# Patient Record
Sex: Male | Born: 1995 | Race: White | Hispanic: No | Marital: Single | Smoking: Never smoker
Health system: Southern US, Community
[De-identification: ages and names within clinical notes are randomized; demographics above are authoritative.]

## PROBLEM LIST (undated history)

## (undated) DIAGNOSIS — S42309A Unspecified fracture of shaft of humerus, unspecified arm, initial encounter for closed fracture: Secondary | ICD-10-CM

## (undated) DIAGNOSIS — S0285XA Fracture of orbit, unspecified, initial encounter for closed fracture: Secondary | ICD-10-CM

## (undated) HISTORY — PX: ORBITAL FRACTURE SURGERY: SHX725

---
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
## 2014-05-25 NOTE — Op Note (Signed)
PATIENT NAME:  Stanley Powers, Stanley G MR#:  [id number] DATE OF BIRTH:  06-22-1995  DATE OF PROCEDURE:  05/26/2011  PREOPERATIVE DIAGNOSIS: Left orbital floor fracture.  POSTOPERATIVE DIAGNOSES:  1. Left orbital floor fracture. 2. Left zygomaticomaxillary fracture. 3. Left inferior orbital rim fracture.  PROCEDURE: Transconjunctival approach to left orbital floor, inferior orbital rim, zygomaticomaxillary buttress fracture.   SURGEON: Zackery BarefootJ. Madison Lamari Youngers, M.D.   DESCRIPTION OF PROCEDURE: The patient was placed in the supine position, on the operating room table. After general endotracheal anesthesia had been induced, the patient was turned 90 degrees clockwise from anesthesia. The left eye was topically anesthetized with topical proparacaine. A corneal shield was placed. In the inferior fornix, 0.5% lidocaine and 0.25% bupivacaine mixed 1:150,000 with epinephrine was injected. This was left in place for 10 minutes. The face was prepped and draped in the usual fashion. A #15 blade was used to make an incision just anterior to the inferior fornix, of the lower eyelid. The dissection was carried down post-septally and the orbital fat was preserved without violation of the orbital fat. The medial orbital rim was skeletonized taking care not to perforate back through the orbital septum anteriorly. The depressed fragments were meticulously skeletonized and there was some difficulty with reduction of the large segment of maxillary bone. The rim fracture extended in the typical location back through the inferior orbital rim and through the inferior wall of the orbit. There was a palpable fracture just lateral to the maxillary eminence, at the anteriorly most junction of the zygomatic arch. This was reduced with manual closed reduction, in addition to the open reduction, after skeletonization of the most medial portion of the maxilla, at the inferior orbital rim. Once this had been skeletonized and reduced sufficiently  and all of the periorbita and orbital fat had been reduced out of the floor fracture, a 1.2 mm Leibinger plate was cut to fit the defect. The plate was carefully placed and secured at two points with a medial self-drilling screw and a lateral self-tapping screw. This led to adequate reduction of the fractures, fixation of the fractures, and reelevation of the orbital tissues back out of the entrapment evidenced during elevation of the inferior periorbita. The wound was closed with a 5-0 Vicryl and the periosteum covering the plate, and the conjunctival incision was closed with interrupted 6-0 fast absorbing gut sutures. The patient was returned to anesthesia, allowed to emerge from anesthesia in the operating room, and taken to room in stable condition. There were no complications. Estimated blood loss was 5 mL. ____________________________ J. Gertie BaronMadison Lacreshia Bondarenko, MD jmc:slb D: 05/27/2011 04:54:[id number]:28:22 ET T: 05/27/2011 10:27:04 ET JOB#: [id number]  cc: Zackery BarefootJ. Madison Ryin Ambrosius, MD, <Dictator> Wendee CoppJMADISON Jaeceon Michelin MD ELECTRONICALLY SIGNED 06/01/2011 18:29

## 2015-10-21 DIAGNOSIS — S060XAA Concussion with loss of consciousness status unknown, initial encounter: Secondary | ICD-10-CM | POA: Insufficient documentation

## 2016-08-01 HISTORY — PX: WISDOM TOOTH EXTRACTION: SHX21

## 2016-08-11 ENCOUNTER — Ambulatory Visit
Admission: EM | Admit: 2016-08-11 | Discharge: 2016-08-11 | Disposition: A | Discharge status: Home / Self Care | Payer: 59 | Attending: Family Medicine | Admitting: Family Medicine

## 2016-08-11 ENCOUNTER — Encounter: Payer: Self-pay | Admitting: Gynecology

## 2016-08-11 DIAGNOSIS — R310 Gross hematuria: Secondary | ICD-10-CM | POA: Diagnosis not present

## 2016-08-11 HISTORY — DX: Unspecified fracture of shaft of humerus, unspecified arm, initial encounter for closed fracture: S42.309A

## 2016-08-11 HISTORY — DX: Fracture of orbit, unspecified, initial encounter for closed fracture: S02.85XA

## 2016-08-11 LAB — BASIC METABOLIC PANEL
Anion gap: 11 (ref 5–15)
BUN: 24 mg/dL — ABNORMAL HIGH (ref 6–20)
CO2: 24 mmol/L (ref 22–32)
Calcium: 9.5 mg/dL (ref 8.9–10.3)
Chloride: 102 mmol/L (ref 101–111)
Creatinine, Ser: 1 mg/dL (ref 0.61–1.24)
GFR calc Af Amer: >60 mL/min (ref >60)
GFR calc non Af Amer: >60 mL/min (ref >60)
Glucose, Bld: 94 mg/dL (ref 65–99)
Potassium: 3.6 mmol/L (ref 3.5–5.1)
Sodium: 137 mmol/L (ref 135–145)

## 2016-08-11 LAB — CBC WITH DIFFERENTIAL/PLATELET
Basophils Absolute: 0.1 10*3/uL (ref 0–0.1)
Basophils Relative: 1 %
Eosinophils Absolute: 0.3 10*3/uL (ref 0–0.7)
Eosinophils Relative: 3 %
HCT: 42.3 % (ref 40.0–52.0)
Hemoglobin: 14.7 g/dL (ref 13.0–18.0)
Lymphocytes Relative: 42 %
Lymphs Abs: 4.2 10*3/uL — ABNORMAL HIGH (ref 1.0–3.6)
MCH: 29.8 pg (ref 26.0–34.0)
MCHC: 34.8 g/dL (ref 32.0–36.0)
MCV: 85.5 fL (ref 80.0–100.0)
Monocytes Absolute: 0.9 10*3/uL (ref 0.2–1.0)
Monocytes Relative: 8 %
Neutro Abs: 4.6 10*3/uL (ref 1.4–6.5)
Neutrophils Relative %: 46 %
Platelets: 281 10*3/uL (ref 150–440)
RBC: 4.95 MIL/uL (ref 4.40–5.90)
RDW: 13.1 % (ref 11.5–14.5)
WBC: 10.1 10*3/uL (ref 3.8–10.6)

## 2016-08-11 LAB — URINALYSIS, COMPLETE (UACMP) WITH MICROSCOPIC
BILIRUBIN URINE: NEGATIVE
Bacteria, UA: NONE SEEN
GLUCOSE, UA: NEGATIVE mg/dL
KETONES UR: NEGATIVE mg/dL
LEUKOCYTES UA: NEGATIVE
NITRITE: NEGATIVE
PH: 5.5 (ref 5.0–8.0)
PROTEIN: 30 mg/dL — AB
SQUAMOUS EPITHELIAL / LPF: NONE SEEN
Specific Gravity, Urine: 1.02 (ref 1.005–1.030)
WBC UA: NONE SEEN WBC/hpf (ref 0–5)

## 2016-08-11 MED ORDER — NITROFURANTOIN MONOHYD MACRO 100 MG PO CAPS
100.0000 mg | ORAL_CAPSULE | Freq: Two times a day (BID) | ORAL | 0 refills | Status: DC
Start: 2016-08-11 — End: 2019-10-09

## 2016-08-11 NOTE — ED Provider Notes (Signed)
CSN: [id number]     Arrival date & time 08/11/16  1725 History   First MD Initiated Contact with Patient 08/11/16 1756     Chief Complaint  Patient presents with  . Hematuria   (Consider location/radiation/quality/duration/timing/severity/associated sxs/prior Treatment) HPI  This is a 21 year old male who presents with symptomatic gross hematuria. The patient works [place] a [place]. He states that this morning when he urinated he noticed some blood. When he got home this afternoon he noticed it again. It is gross.Marland Kitchen Has no clots. Said no fever or chills. Denies any testicular or penile pain. Never had this before. He denies any penile trauma.        Past Medical History:  Diagnosis Date  . Broken arm   . Orbital fracture [facility])    Past Surgical History:  Procedure Laterality Date  . ORBITAL FRACTURE SURGERY    . WISDOM TOOTH EXTRACTION Bilateral 08/01/2016   No family history on file. Social History  Substance Use Topics  . Smoking status: Never Smoker  . Smokeless tobacco: Never Used  . Alcohol use Not on file    Review of Systems  Constitutional: Negative for activity change, chills, fatigue and fever.  Genitourinary: Positive for hematuria. Negative for decreased urine volume, difficulty urinating, discharge, dysuria, flank pain, frequency, genital sores, penile pain, penile swelling, scrotal swelling, testicular pain and urgency.  All other systems reviewed and are negative.   Allergies  Patient has no known allergies.  Home Medications   Prior to Admission medications   Medication Sig Start Date End Date Taking? Authorizing Provider  HYDROcodone-acetaminophen (NORCO/VICODIN) 5-325 MG tablet Take 1 tablet by mouth every 6 (six) hours as needed for moderate pain.   Yes [provider]  ibuprofen (ADVIL,MOTRIN) 600 MG tablet Take 600 mg by mouth every 6 (six) hours as needed.   Yes [provider]  nitrofurantoin, macrocrystal-monohydrate,  (MACROBID) 100 MG capsule Take 1 capsule (100 mg total) by mouth 2 (two) times daily. 08/11/16   Lutricia Feil, PA-C   Meds Ordered and Administered this Visit  Medications - No data to display  BP 119/73 (BP Location: Left Arm)   Pulse 69   Temp 98.2 F (36.8 C) (Oral)   Resp 16   Ht 6\' 2"  (1.88 m)   Wt 170 lb (77.1 kg)   SpO2 100%   BMI 21.83 kg/m  No data found.   Physical Exam  Constitutional: He is oriented to person, place, and time. He appears well-developed and well-nourished. No distress.  HENT:  Head: Normocephalic.  Eyes: Pupils are equal, round, and reactive to light.  Neck: Normal range of motion.  Pulmonary/Chest: Effort normal and breath sounds normal.  Abdominal: Soft. Bowel sounds are normal. He exhibits no distension and no mass. There is no tenderness. There is no rebound and no guarding.  Genitourinary:  Genitourinary Comments: Deferred for urologist  Musculoskeletal: Normal range of motion.  Neurological: He is alert and oriented to person, place, and time.  Skin: Skin is warm and dry. He is not diaphoretic.  Psychiatric: He has a normal mood and affect. His behavior is normal. Judgment and thought content normal.  Nursing note and vitals reviewed.   Urgent Care Course     Procedures (including critical care time)  Labs Review Labs Reviewed  URINALYSIS, COMPLETE (UACMP) WITH MICROSCOPIC - Abnormal; Notable for the following:       Result Value   Color, Urine RED (*)    APPearance CLOUDY (*)  Hgb urine dipstick LARGE (*)    Protein, ur 30 (*)    All other components within normal limits  CBC WITH DIFFERENTIAL/PLATELET - Abnormal; Notable for the following:    Lymphs Abs 4.2 (*)    All other components within normal limits  BASIC METABOLIC PANEL - Abnormal; Notable for the following:    BUN 24 (*)    All other components within normal limits  URINE CULTURE    Imaging Review No results found.   Visual Acuity Review  Right Eye  Distance:   Left Eye Distance:   Bilateral Distance:    Right Eye Near:   Left Eye Near:    Bilateral Near:         MDM   1. Gross hematuria    Discharge Medication List as of 08/11/2016  7:21 PM    START taking these medications   Details  nitrofurantoin, macrocrystal-monohydrate, (MACROBID) 100 MG capsule Take 1 capsule (100 mg total) by mouth 2 (two) times daily., Starting Thu 08/11/2016, Normal      Plan: 1. Test/x-ray results and diagnosis reviewed with patient 2. rx as per orders; risks, benefits, potential side effects reviewed with patient 3. Recommend supportive treatment with Drinking increased fluids. Increasing use of ibuprofen. I recommended one ibuprofen combined with 1 Tylenol 500 for pain only as necessary. She [facility] He'll call make the appointment tomorrow. If he has any change in his condition he will go immediately to the emergency room tonight. Urine culture results will be available in 48 hours. 4. F/u prn if symptoms worsen or don't improve     Lutricia FeilRoemer, William P, PA-C 08/11/16 1947

## 2016-08-11 NOTE — ED Triage Notes (Signed)
Patient stated notice blood in his urine today at work after using the restroom.

## 2016-08-11 NOTE — Discharge Instructions (Signed)
Drink plenty of fluids. Decrease ibuprofen. Recommend combining 1 ibuprofen with Tylenol 500 mg for pain as necessary. Arrange an appointment with urologist as soon as possible.

## 2016-08-13 LAB — URINE CULTURE: Culture: NO GROWTH

## 2018-11-13 ENCOUNTER — Other Ambulatory Visit: Payer: Self-pay

## 2018-11-13 DIAGNOSIS — Z20822 Contact with and (suspected) exposure to covid-19: Secondary | ICD-10-CM

## 2018-11-15 LAB — NOVEL CORONAVIRUS, NAA: SARS-CoV-2, NAA: NOT DETECTED

## 2019-05-16 ENCOUNTER — Ambulatory Visit: Payer: 59 | Admitting: Dermatology

## 2019-06-26 ENCOUNTER — Ambulatory Visit: Payer: 59 | Admitting: Dermatology

## 2019-06-28 ENCOUNTER — Ambulatory Visit: Payer: 59 | Admitting: Dermatology

## 2019-10-03 ENCOUNTER — Other Ambulatory Visit: Payer: Self-pay

## 2019-10-03 ENCOUNTER — Ambulatory Visit: Payer: Self-pay

## 2019-10-03 DIAGNOSIS — Z021 Encounter for pre-employment examination: Secondary | ICD-10-CM

## 2019-10-03 LAB — POCT URINALYSIS DIPSTICK
Bilirubin, UA: NEGATIVE
Blood, UA: NEGATIVE
Glucose, UA: NEGATIVE
Ketones, UA: NEGATIVE
Leukocytes, UA: NEGATIVE
Nitrite, UA: NEGATIVE
Protein, UA: NEGATIVE
Spec Grav, UA: 1.02 (ref 1.010–1.025)
Urobilinogen, UA: 0.2 E.U./dL
pH, UA: 6.5 (ref 5.0–8.0)

## 2019-10-03 NOTE — Progress Notes (Signed)
Pt presents today for partial Pre employment Physical and UDS.   Lab [employer] ID:  [id number]

## 2019-10-05 LAB — CMP12+LP+TP+TSH+6AC+CBC/D/PLT
ALT: 13 IU/L (ref 0–44)
AST: 12 IU/L (ref 0–40)
Albumin/Globulin Ratio: 1.6 (ref 1.2–2.2)
Albumin: 5.1 g/dL (ref 4.1–5.2)
Alkaline Phosphatase: 74 IU/L (ref 48–121)
BUN/Creatinine Ratio: 21 — ABNORMAL HIGH (ref 9–20)
BUN: 19 mg/dL (ref 6–20)
Basophils Absolute: 0 10*3/uL (ref 0.0–0.2)
Basos: 1 %
Bilirubin Total: 0.6 mg/dL (ref 0.0–1.2)
Calcium: 10.1 mg/dL (ref 8.7–10.2)
Chloride: 100 mmol/L (ref 96–106)
Chol/HDL Ratio: 3.4 ratio (ref 0.0–5.0)
Cholesterol, Total: 162 mg/dL (ref 100–199)
Creatinine, Ser: 0.91 mg/dL (ref 0.76–1.27)
EOS (ABSOLUTE): 0 10*3/uL (ref 0.0–0.4)
Eos: 1 %
Estimated CHD Risk: 0.5 times avg. (ref 0.0–1.0)
Free Thyroxine Index: 1.9 (ref 1.2–4.9)
GFR calc Af Amer: 136 mL/min/{1.73_m2} (ref >59)
GFR calc non Af Amer: 118 mL/min/{1.73_m2} (ref >59)
GGT: 11 IU/L (ref 0–65)
Globulin, Total: 3.1 g/dL (ref 1.5–4.5)
Glucose: 82 mg/dL (ref 65–99)
HDL: 48 mg/dL (ref >39)
Hematocrit: 53.9 % — ABNORMAL HIGH (ref 37.5–51.0)
Hemoglobin: 18.7 g/dL — ABNORMAL HIGH (ref 13.0–17.7)
Immature Grans (Abs): 0 10*3/uL (ref 0.0–0.1)
Immature Granulocytes: 0 %
Iron: 189 ug/dL — ABNORMAL HIGH (ref 38–169)
LDH: 120 IU/L — ABNORMAL LOW (ref 121–224)
LDL Chol Calc (NIH): 99 mg/dL (ref 0–99)
Lymphocytes Absolute: 2.5 10*3/uL (ref 0.7–3.1)
Lymphs: 39 %
MCH: 30.2 pg (ref 26.6–33.0)
MCHC: 34.7 g/dL (ref 31.5–35.7)
MCV: 87 fL (ref 79–97)
Monocytes Absolute: 0.5 10*3/uL (ref 0.1–0.9)
Monocytes: 8 %
Neutrophils Absolute: 3.3 10*3/uL (ref 1.4–7.0)
Neutrophils: 51 %
Phosphorus: 3.5 mg/dL (ref 2.8–4.1)
Platelets: 302 10*3/uL (ref 150–450)
Potassium: 4.5 mmol/L (ref 3.5–5.2)
RBC: 6.2 x10E6/uL — ABNORMAL HIGH (ref 4.14–5.80)
RDW: 13.1 % (ref 11.6–15.4)
Sodium: 138 mmol/L (ref 134–144)
T3 Uptake Ratio: 29 % (ref 24–39)
T4, Total: 6.5 ug/dL (ref 4.5–12.0)
TSH: 2.96 u[IU]/mL (ref 0.450–4.500)
Total Protein: 8.2 g/dL (ref 6.0–8.5)
Triglycerides: 76 mg/dL (ref 0–149)
Uric Acid: 4.4 mg/dL (ref 3.8–8.4)
VLDL Cholesterol Cal: 15 mg/dL (ref 5–40)
WBC: 6.3 10*3/uL (ref 3.4–10.8)

## 2019-10-05 LAB — QUANTIFERON-TB GOLD PLUS
QuantiFERON Mitogen Value: >10 IU/mL
QuantiFERON Nil Value: 0.04 IU/mL
QuantiFERON TB1 Ag Value: 0.04 IU/mL
QuantiFERON TB2 Ag Value: 0.07 IU/mL
QuantiFERON-TB Gold Plus: NEGATIVE

## 2019-10-05 LAB — HEPATITIS B SURFACE ANTIBODY,QUALITATIVE: Hep B Surface Ab, Qual: NONREACTIVE

## 2019-10-09 ENCOUNTER — Encounter: Payer: Self-pay | Admitting: Physician Assistant

## 2019-10-09 ENCOUNTER — Ambulatory Visit: Payer: Self-pay | Admitting: Physician Assistant

## 2019-10-09 ENCOUNTER — Other Ambulatory Visit: Payer: Self-pay

## 2019-10-09 VITALS — BP 107/70 | HR 68 | Temp 99.0°F | Resp 12 | Ht 74.0 in | Wt 176.0 lb

## 2019-10-09 DIAGNOSIS — Z021 Encounter for pre-employment examination: Secondary | ICD-10-CM

## 2019-10-09 NOTE — Progress Notes (Signed)
° °  Subjective: Firefighter exam    Patient ID: Stanley Powers, male    DOB: [birth year], 24 y.o.   MRN: [id number]  HPI Patient presents with cough exam voices no complaints or concerns.   Review of Systems Negative    Objective:   Physical Exam HEENT is unremarkable.  Neck is supple for adenopathy or bruits.  Lungs are clear to auscultation.  Heart regular rate and rhythm.  Abdomen negative HSM, normoactive bowel sounds, soft and nontender to palpation.  No obvious deformity to the upper or lower extremities.  Patient has full and equal range of motion of the upper and lower extremities.  No obvious cervical or lumbar spine deformity.  Patient has full and equal range of motion of the cervical lumbar spine.  Cranial nerves II through XII are grossly intact.       Assessment & Plan: Well exam  Discussed labs and EKG results with patient.  Follow-up as necessary.

## 2019-10-15 ENCOUNTER — Other Ambulatory Visit: Payer: Self-pay

## 2019-10-15 ENCOUNTER — Ambulatory Visit: Payer: Self-pay

## 2019-10-15 DIAGNOSIS — Z23 Encounter for immunization: Secondary | ICD-10-CM

## 2019-10-15 NOTE — Progress Notes (Signed)
Pt presented today for Tdap & Hep vaccinations. CL,RMA

## 2019-11-29 ENCOUNTER — Other Ambulatory Visit: Payer: Self-pay

## 2019-11-29 DIAGNOSIS — Z0184 Encounter for antibody response examination: Secondary | ICD-10-CM

## 2019-11-30 LAB — HEPATITIS B SURFACE ANTIBODY,QUALITATIVE: Hep B Surface Ab, Qual: REACTIVE

## 2021-03-02 ENCOUNTER — Ambulatory Visit: Payer: Self-pay

## 2021-03-02 ENCOUNTER — Other Ambulatory Visit: Payer: Self-pay

## 2021-03-02 DIAGNOSIS — Z0000 Encounter for general adult medical examination without abnormal findings: Secondary | ICD-10-CM

## 2021-03-02 DIAGNOSIS — Z0289 Encounter for other administrative examinations: Secondary | ICD-10-CM

## 2021-03-02 LAB — POCT URINALYSIS DIPSTICK
Appearance: NORMAL
Bilirubin, UA: NEGATIVE
Blood, UA: NEGATIVE
Glucose, UA: NEGATIVE
Ketones, UA: NEGATIVE
Leukocytes, UA: NEGATIVE
Nitrite, UA: NEGATIVE
Protein, UA: NEGATIVE
Spec Grav, UA: <=1.005 — AB (ref 1.010–1.025)
Urobilinogen, UA: 0.2 E.U./dL
pH, UA: 7 (ref 5.0–8.0)

## 2021-03-02 NOTE — Progress Notes (Signed)
03/08/21 annual physical sheduled

## 2021-03-03 LAB — CMP12+LP+TP+TSH+6AC+CBC/D/PLT
ALT: 17 IU/L (ref 0–44)
AST: 18 IU/L (ref 0–40)
Albumin/Globulin Ratio: 2 (ref 1.2–2.2)
Albumin: 4.7 g/dL (ref 4.1–5.2)
Alkaline Phosphatase: 57 IU/L (ref 44–121)
BUN/Creatinine Ratio: 20 (ref 9–20)
BUN: 16 mg/dL (ref 6–20)
Basophils Absolute: 0 10*3/uL (ref 0.0–0.2)
Basos: 0 %
Bilirubin Total: 0.7 mg/dL (ref 0.0–1.2)
Calcium: 9.8 mg/dL (ref 8.7–10.2)
Chloride: 101 mmol/L (ref 96–106)
Chol/HDL Ratio: 2.8 ratio (ref 0.0–5.0)
Cholesterol, Total: 162 mg/dL (ref 100–199)
Creatinine, Ser: 0.82 mg/dL (ref 0.76–1.27)
EOS (ABSOLUTE): 0.1 10*3/uL (ref 0.0–0.4)
Eos: 2 %
Estimated CHD Risk: <0.5 times avg. (ref 0.0–1.0)
Free Thyroxine Index: 1.7 (ref 1.2–4.9)
GGT: 11 IU/L (ref 0–65)
Globulin, Total: 2.4 g/dL (ref 1.5–4.5)
Glucose: 93 mg/dL (ref 70–99)
HDL: 58 mg/dL (ref >39)
Hematocrit: 45.7 % (ref 37.5–51.0)
Hemoglobin: 15.7 g/dL (ref 13.0–17.7)
Immature Grans (Abs): 0 10*3/uL (ref 0.0–0.1)
Immature Granulocytes: 0 %
Iron: 245 ug/dL — ABNORMAL HIGH (ref 38–169)
LDH: 118 IU/L — ABNORMAL LOW (ref 121–224)
LDL Chol Calc (NIH): 92 mg/dL (ref 0–99)
Lymphocytes Absolute: 2.6 10*3/uL (ref 0.7–3.1)
Lymphs: 48 %
MCH: 29.7 pg (ref 26.6–33.0)
MCHC: 34.4 g/dL (ref 31.5–35.7)
MCV: 86 fL (ref 79–97)
Monocytes Absolute: 0.4 10*3/uL (ref 0.1–0.9)
Monocytes: 7 %
Neutrophils Absolute: 2.4 10*3/uL (ref 1.4–7.0)
Neutrophils: 43 %
Phosphorus: 3.6 mg/dL (ref 2.8–4.1)
Platelets: 279 10*3/uL (ref 150–450)
Potassium: 4.1 mmol/L (ref 3.5–5.2)
RBC: 5.29 x10E6/uL (ref 4.14–5.80)
RDW: 12.7 % (ref 11.6–15.4)
Sodium: 138 mmol/L (ref 134–144)
T3 Uptake Ratio: 27 % (ref 24–39)
T4, Total: 6.3 ug/dL (ref 4.5–12.0)
TSH: 2.45 u[IU]/mL (ref 0.450–4.500)
Total Protein: 7.1 g/dL (ref 6.0–8.5)
Triglycerides: 63 mg/dL (ref 0–149)
Uric Acid: 5.1 mg/dL (ref 3.8–8.4)
VLDL Cholesterol Cal: 12 mg/dL (ref 5–40)
WBC: 5.6 10*3/uL (ref 3.4–10.8)
eGFR: 124 mL/min/{1.73_m2} (ref >59)

## 2021-03-08 ENCOUNTER — Encounter: Payer: Self-pay | Admitting: Physician Assistant

## 2021-03-08 ENCOUNTER — Other Ambulatory Visit: Payer: Self-pay

## 2021-03-08 ENCOUNTER — Ambulatory Visit: Payer: Self-pay | Admitting: Physician Assistant

## 2021-03-08 DIAGNOSIS — Z0000 Encounter for general adult medical examination without abnormal findings: Secondary | ICD-10-CM

## 2021-03-08 NOTE — Progress Notes (Signed)
Pt concerned of iron levels being to high in labs and wants to discuss it./CL,RMA

## 2021-03-08 NOTE — Progress Notes (Signed)
[place] [facility]  ____________________________________________   None    (approximate)  I have reviewed the triage vital signs and the nursing notes.   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  Patient is most concerned secondary to increasing iron levels.  Denies history of anemia.         Past Medical History:  Diagnosis Date   Broken arm    Orbital fracture [facility])     Patient Active Problem List   Diagnosis Date Noted   Concussion 10/21/2015    Past Surgical History:  Procedure Laterality Date   ORBITAL FRACTURE SURGERY     WISDOM TOOTH EXTRACTION Bilateral 08/01/2016    Prior to Admission medications   Not on File    Allergies Patient has no known allergies.  History reviewed. No pertinent family history.  Social History Social History   Tobacco Use   Smoking status: Never   Smokeless tobacco: Never  Substance Use Topics   Drug use: No    Review of Systems Constitutional: No fever/chills Eyes: No visual changes. ENT: No sore throat. Cardiovascular: Denies chest pain. Respiratory: Denies shortness of breath. Gastrointestinal: No abdominal pain.  No nausea, no vomiting.  No diarrhea.  No constipation. Genitourinary: Negative for dysuria. Musculoskeletal: Negative for back pain. Skin: Negative for rash. Neurological: Negative for headaches, focal weakness or numbness. Hematological/Lymphatic: Increased iron levels   ____________________________________________   PHYSICAL EXAM:  VITAL SIGNS: Temperature is 98, pulse 97, respiration 14, BP is 125/75, patient with 185 pounds and BMI is 23.75. Constitutional: Alert and oriented. Well appearing and in no acute distress. Eyes: Conjunctivae are normal. PERRL. EOMI. Head: Atraumatic. Nose: No congestion/rhinnorhea. Mouth/Throat: Mucous membranes are moist.  Oropharynx  non-erythematous. Neck: No stridor.  No cervical spine tenderness to palpation. Hematological/Lymphatic/Immunilogical: No cervical lymphadenopathy. Cardiovascular: Normal rate, regular rhythm. Grossly normal heart sounds.  Good peripheral circulation. Respiratory: Normal respiratory effort.  No retractions. Lungs CTAB. Gastrointestinal: Soft and nontender. No distention. No abdominal bruits. No CVA tenderness. Genitourinary: Deferred Musculoskeletal: No lower extremity tenderness nor edema.  No joint effusions. Neurologic:  Normal speech and language. No gross focal neurologic deficits are appreciated. No gait instability. Skin:  Skin is warm, dry and intact. No rash noted. Psychiatric: Mood and affect are normal. Speech and behavior are normal.  ____________________________________________   LABS        Component Ref Range & Units 6 d ago (03/02/21) 1 yr ago (10/03/19) 4 yr ago (08/11/16) 4 yr ago (08/11/16)  Glucose 70 - 99 mg/dL 93  82 R  94 R    Uric Acid 3.8 - 8.4 mg/dL 5.1  4.4 CM     Comment:            Therapeutic target for gout patients: <6.0  BUN 6 - 20 mg/dL '16  19  24 ' High     Creatinine, Ser 0.76 - 1.27 mg/dL 0.82  0.91  1.00 R    eGFR >59 mL/min/1.73 124      BUN/Creatinine Ratio 9 - '20 20  21 ' High      Sodium 134 - 144 mmol/L 138  138  137 R    Potassium 3.5 - 5.2 mmol/L 4.1  4.5  3.6 R    Chloride 96 - 106 mmol/L 101  100  102 R    Calcium 8.7 - 10.2 mg/dL 9.8  10.1  9.5  R    Phosphorus 2.8 - 4.1 mg/dL 3.6  3.5     Total Protein 6.0 - 8.5 g/dL 7.1  8.2     Albumin 4.1 - 5.2 g/dL 4.7  5.1     Globulin, Total 1.5 - 4.5 g/dL 2.4  3.1     Albumin/Globulin Ratio 1.2 - 2.2 2.0  1.6     Bilirubin Total 0.0 - 1.2 mg/dL 0.7  0.6     Alkaline Phosphatase 44 - 121 IU/L 57  74 R, CM     LDH 121 - 224 IU/L 118 Low   120 Low      AST 0 - 40 IU/L 18  12     ALT 0 - 44 IU/L 17  13     GGT 0 - 65 IU/L 11  11     Iron 38 - 169 ug/dL 245 High   189 High      Cholesterol, Total  100 - 199 mg/dL 162  162     Triglycerides 0 - 149 mg/dL 63  76     HDL >39 mg/dL 58  48     VLDL Cholesterol Cal 5 - 40 mg/dL 12  15     LDL Chol Calc (NIH) 0 - 99 mg/dL 92  99     Chol/HDL Ratio 0.0 - 5.0 ratio 2.8  3.4 CM     Comment:                                   T. Chol/HDL Ratio                                              Men  Women                                1/2 Avg.Risk  3.4    3.3                                    Avg.Risk  5.0    4.4                                 2X Avg.Risk  9.6    7.1                                 3X Avg.Risk 23.4   11.0   Estimated CHD Risk 0.0 - 1.0 times avg.  < 0.5  0.5 CM     Comment: The CHD Risk is based on the T. Chol/HDL ratio. Other  factors affect CHD Risk such as hypertension, smoking,  diabetes, severe obesity, and family history of  premature CHD.   TSH 0.450 - 4.500 uIU/mL 2.450  2.960     T4, Total 4.5 - 12.0 ug/dL 6.3  6.5     T3 Uptake Ratio 24 - 39 % 27  29     Free Thyroxine Index 1.2 - 4.9 1.7  1.9     WBC 3.4 - 10.8 x10E3/uL 5.6  6.3  10.1 R   RBC 4.14 - 5.80 x10E6/uL 5.29  6.20 High    4.95 R   Hemoglobin 13.0 - 17.7 g/dL 15.7  18.7 High    14.7 R   Hematocrit 37.5 - 51.0 % 45.7  53.9 High    42.3 R   MCV 79 - 97 fL 86  87   85.5 R   MCH 26.6 - 33.0 pg 29.7  30.2   29.8 R   MCHC 31.5 - 35.7 g/dL 34.4  34.7   34.8 R   RDW 11.6 - 15.4 % 12.7  13.1   13.1 R   Platelets 150 - 450 x10E3/uL 279  302   281 R   Neutrophils Not Estab. % 43  51   46 R   Lymphs Not Estab. % 48  39     Monocytes Not Estab. % 7  8     Eos Not Estab. % 2  1     Basos Not Estab. % 0  1     Neutrophils Absolute 1.4 - 7.0 x10E3/uL 2.4  3.3   4.6 R   Lymphocytes Absolute 0.7 - 3.1 x10E3/uL 2.6  2.5   4.2 High  R   Monocytes Absolute 0.1 - 0.9 x10E3/uL 0.4  0.5     EOS (ABSOLUTE) 0.0 - 0.4 x10E3/uL 0.1  0.0     Basophils Absolute 0.0 - 0.2 x10E3/uL 0.0  0.0   0.1 R   Immature Granulocytes Not Estab. % 0  0     Immature Grans (Abs) 0.0 - 0.1  x10E3/uL 0.0  0.0     Resulting Agency  [facility]       Narrative Performed by: Maryan Puls Performed at:  [facility]  [address] [place], [place]  [id number]  Lab Director: Rush Farmer MD, Phone:  [phone]    Specimen Collected: 03/02/21 09:07 Last Resulted: 03/03/21 08:13      Lab Flowsheet    Order Details    View Encounter    Lab and Collection Details    Routing    Result History    View Encounter Conversation      CM=Additional comments  R=Reference range differs from displayed range      Result Care Coordination   Patient Communication   Add Comments   Seen Back to Top       Other Results from 03/02/2021   Contains abnormal data POCT urinalysis dipstick Order: [id number] Status: Final result    Visible to patient: Yes (seen)    Next appt: None    Dx: Annual physical exam    0 Result Notes       Component Ref Range & Units 6 d ago 1 yr ago 4 yr ago  Color, UA  light yellow  yellow    Clarity, UA  clear  clear    Glucose, UA Negative Negative  Negative    Bilirubin, UA  negative  negative    Ketones, UA  negative  negative    Spec Grav, UA 1.010 - 1.025 <=1.005 Abnormal   1.020    Blood, UA  negative  negative    pH, UA 5.0 - 8.0 7.0  6.5    Protein, UA Negative Negative  Negative    Urobilinogen, UA 0.2 or 1.0 E.U./dL 0.2  0.2    Nitrite, UA  negative  negative    Leukocytes, UA Negative Negative  Negative  NEGATIVE R  Appearance  normal  medium  CLOUDY Abnormal  R             ___________________________________________  EKG Sinus  Rhythm  -With rate variation  cv = 11. -RSR(V1) -nondiagnostic.   Voltage criteria for LVH  (R(V5) exceeds 2.60 mV)  -Voltage criteria w/o ST/T abnormality may be normal.  61 bpm. BORDERLINE.   ____________________________________________   ____________________________________________   INITIAL IMPRESSION / ASSESSMENT AND PLAN   As part of my medical decision  making, I reviewed the following data within the electronic MEDICAL RECORD NUMBER   Discussed hemochromatosis with patient.  We will get a ferritin level and follow-up in 2 to 3 days.           ____________________________________________   FINAL CLINICAL IMPRESSION(S) DIAGNOSES  Well exam.   ED Discharge Orders          Ordered    Ferritin       Comments: Elevated iron levels without anemia    03/08/21 1042             Note:  This document was prepared using Dragon voice recognition software and may include unintentional dictation errors.

## 2021-03-09 ENCOUNTER — Other Ambulatory Visit: Payer: Self-pay | Admitting: Physician Assistant

## 2021-03-09 LAB — FERRITIN: Ferritin: 186 ng/mL (ref 30–400)

## 2021-03-10 LAB — IRON AND TIBC
Iron Saturation: 17 % (ref 15–55)
Iron: 49 ug/dL (ref 38–169)
Total Iron Binding Capacity: 290 ug/dL (ref 250–450)
UIBC: 241 ug/dL (ref 111–343)

## 2021-03-10 LAB — SPECIMEN STATUS REPORT

## 2021-03-11 ENCOUNTER — Other Ambulatory Visit: Payer: Self-pay

## 2021-03-11 NOTE — Progress Notes (Signed)
Pt presents today for lab work per [facility]

## 2021-03-16 LAB — HEMOCHROMATOSIS DNA-PCR(C282Y,H63D)

## 2022-02-10 ENCOUNTER — Telehealth: Payer: Self-pay

## 2022-02-10 NOTE — Telephone Encounter (Addendum)
Pt will call back Tuesday to schedule physical.

## 2022-02-22 ENCOUNTER — Ambulatory Visit: Payer: Self-pay

## 2022-02-22 DIAGNOSIS — Z0000 Encounter for general adult medical examination without abnormal findings: Secondary | ICD-10-CM

## 2022-02-22 LAB — POCT URINALYSIS DIPSTICK
Bilirubin, UA: NEGATIVE
Blood, UA: NEGATIVE
Glucose, UA: NEGATIVE
Ketones, UA: NEGATIVE
Leukocytes, UA: NEGATIVE
Nitrite, UA: NEGATIVE
Protein, UA: NEGATIVE
Spec Grav, UA: 1.02 (ref 1.010–1.025)
Urobilinogen, UA: 0.2 E.U./dL
pH, UA: 7 (ref 5.0–8.0)

## 2022-02-22 NOTE — Progress Notes (Signed)
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 Pt scheduled to return and complete.

## 2022-02-23 LAB — CMP12+LP+TP+TSH+6AC+CBC/D/PLT
ALT: 16 IU/L (ref 0–44)
AST: 13 IU/L (ref 0–40)
Albumin/Globulin Ratio: 1.9 (ref 1.2–2.2)
Albumin: 4.7 g/dL (ref 4.3–5.2)
Alkaline Phosphatase: 54 IU/L (ref 44–121)
BUN/Creatinine Ratio: 20 (ref 9–20)
BUN: 16 mg/dL (ref 6–20)
Basophils Absolute: 0 10*3/uL (ref 0.0–0.2)
Basos: 1 %
Bilirubin Total: 0.5 mg/dL (ref 0.0–1.2)
Calcium: 9.8 mg/dL (ref 8.7–10.2)
Chloride: 104 mmol/L (ref 96–106)
Chol/HDL Ratio: 3.4 ratio (ref 0.0–5.0)
Cholesterol, Total: 179 mg/dL (ref 100–199)
Creatinine, Ser: 0.8 mg/dL (ref 0.76–1.27)
EOS (ABSOLUTE): 0.1 10*3/uL (ref 0.0–0.4)
Eos: 2 %
Estimated CHD Risk: 0.5 times avg. (ref 0.0–1.0)
Free Thyroxine Index: 2 (ref 1.2–4.9)
GGT: 9 IU/L (ref 0–65)
Globulin, Total: 2.5 g/dL (ref 1.5–4.5)
Glucose: 90 mg/dL (ref 70–99)
HDL: 52 mg/dL (ref >39)
Hematocrit: 46.2 % (ref 37.5–51.0)
Hemoglobin: 15.9 g/dL (ref 13.0–17.7)
Immature Grans (Abs): 0.1 10*3/uL (ref 0.0–0.1)
Immature Granulocytes: 1 %
Iron: 167 ug/dL (ref 38–169)
LDH: 126 IU/L (ref 121–224)
LDL Chol Calc (NIH): 117 mg/dL — ABNORMAL HIGH (ref 0–99)
Lymphocytes Absolute: 2.7 10*3/uL (ref 0.7–3.1)
Lymphs: 46 %
MCH: 30.2 pg (ref 26.6–33.0)
MCHC: 34.4 g/dL (ref 31.5–35.7)
MCV: 88 fL (ref 79–97)
Monocytes Absolute: 0.4 10*3/uL (ref 0.1–0.9)
Monocytes: 7 %
Neutrophils Absolute: 2.5 10*3/uL (ref 1.4–7.0)
Neutrophils: 43 %
Phosphorus: 3.2 mg/dL (ref 2.8–4.1)
Platelets: 273 10*3/uL (ref 150–450)
Potassium: 4.2 mmol/L (ref 3.5–5.2)
RBC: 5.27 x10E6/uL (ref 4.14–5.80)
RDW: 12.4 % (ref 11.6–15.4)
Sodium: 142 mmol/L (ref 134–144)
T3 Uptake Ratio: 30 % (ref 24–39)
T4, Total: 6.7 ug/dL (ref 4.5–12.0)
TSH: 2.27 u[IU]/mL (ref 0.450–4.500)
Total Protein: 7.2 g/dL (ref 6.0–8.5)
Triglycerides: 53 mg/dL (ref 0–149)
Uric Acid: 4.5 mg/dL (ref 3.8–8.4)
VLDL Cholesterol Cal: 10 mg/dL (ref 5–40)
WBC: 5.8 10*3/uL (ref 3.4–10.8)
eGFR: 124 mL/min/{1.73_m2} (ref >59)

## 2022-02-28 ENCOUNTER — Ambulatory Visit: Payer: Self-pay | Admitting: Physician Assistant

## 2022-02-28 ENCOUNTER — Encounter: Payer: Self-pay | Admitting: Physician Assistant

## 2022-02-28 VITALS — BP 116/75 | HR 70 | Temp 98.0°F | Resp 12 | Ht 74.0 in | Wt 190.0 lb

## 2022-02-28 DIAGNOSIS — Z0000 Encounter for general adult medical examination without abnormal findings: Secondary | ICD-10-CM

## 2022-02-28 NOTE — Progress Notes (Signed)
[facility]   ____________________________________________   None    (approximate)  I have reviewed the triage vital signs and the nursing notes.   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  Patient voiced no concerns or complaints.         Past Medical History:  Diagnosis Date   Broken arm    Orbital fracture [facility])     Patient Active Problem List   Diagnosis Date Noted   Concussion 10/21/2015    Past Surgical History:  Procedure Laterality Date   ORBITAL FRACTURE SURGERY     WISDOM TOOTH EXTRACTION Bilateral 08/01/2016    Prior to Admission medications   Not on File    Allergies Patient has no known allergies.  History reviewed. No pertinent family history.  Social History Social History   Tobacco Use   Smoking status: Never   Smokeless tobacco: Never  Substance Use Topics   Drug use: No    Review of Systems Constitutional: No fever/chills Eyes: No visual changes. ENT: No sore throat. Cardiovascular: Denies chest pain. Respiratory: Denies shortness of breath. Gastrointestinal: No abdominal pain.  No nausea, no vomiting.  No diarrhea.  No constipation. Genitourinary: Negative for dysuria. Musculoskeletal: Negative for back pain. Skin: Negative for rash. Neurological: Negative for headaches, focal weakness or numbness.   ____________________________________________   PHYSICAL EXAM:  VITAL SIGNS: BP is 116/75, pulse 70, respiration 12, temperature 98, and patient 100 send O2 sat on room air.  Patient weighs 190 pounds and BMI is 24.39. Constitutional: Alert and oriented. Well appearing and in no acute distress. Eyes: Conjunctivae are normal. PERRL. EOMI. Head: Atraumatic. Nose: No congestion/rhinnorhea. Mouth/Throat: Mucous membranes are moist.  Oropharynx non-erythematous. Neck:  No stridor.  No cervical spine tenderness to palpation. Hematological/Lymphatic/Immunilogical: No cervical lymphadenopathy. Cardiovascular: Normal rate, regular rhythm. Grossly normal heart sounds.  Good peripheral circulation. Respiratory: Normal respiratory effort.  No retractions. Lungs CTAB. Gastrointestinal: Soft and nontender. No distention. No abdominal bruits. No CVA tenderness. Genitourinary: Deferred Musculoskeletal: No lower extremity tenderness nor edema.  No joint effusions. Neurologic:  Normal speech and language. No gross focal neurologic deficits are appreciated. No gait instability. Skin:  Skin is warm, dry and intact. No rash noted. Psychiatric: Mood and affect are normal. Speech and behavior are normal.  ____________________________________________   LABS _        Component Ref Range & Units 6 d ago 12 mo ago 2 yr ago 5 yr ago  Color, UA  Dark Yellow light yellow yellow   Clarity, UA  Clear clear clear   Glucose, UA Negative Negative Negative Negative   Bilirubin, UA  Negative negative negative   Ketones, UA  Negative negative negative   Spec Grav, UA 1.010 - 1.025 1.020 <=1.005 Abnormal  1.020   Blood, UA  Negative negative negative   pH, UA 5.0 - 8.0 7.0 7.0 6.5   Protein, UA Negative Negative Negative Negative   Urobilinogen, UA 0.2 or 1.0 E.U./dL 0.2 0.2 0.2   Nitrite, UA  Negative negative negative   Leukocytes, UA Negative Negative Negative Negative NEGATIVE R  Appearance   normal medium CLOUDY Abnormal  R  Odor       Resulting Agency     [facility]         Specimen Collected: 02/22/22 09:48 Last Resulted: 02/22/22 09:48  Lab Development worker, community History    View All Conversations on this Encounter      R=Reference range differs from displayed range      Result Care Coordination   Patient Communication   Add Comments   Seen Back to Top       Other Results from 02/22/2022   Contains abnormal data CMP12+LP+TP+TSH+6AC+CBC/D/Plt Order: [id number] Status: Final result      Visible to patient: Yes (seen)      Next appt: None      Dx: Annual physical exam    0 Result Notes             Component Ref Range & Units 6 d ago (02/22/22) 11 mo ago (03/08/21) 12 mo ago (03/02/21) 2 yr ago (10/03/19) 5 yr ago (08/11/16) 5 yr ago (08/11/16)  Glucose 70 - 99 mg/dL 90  93 82 R 94 R   Uric Acid 3.8 - 8.4 mg/dL 4.5  5.1 CM 4.4 CM    Comment:            Therapeutic target for gout patients: <6.0  BUN 6 - 20 mg/dL 16  16 19 24  High    Creatinine, Ser 0.76 - 1.27 mg/dL  6.96 7.89 3.81 R   eGFR >59 mL/min/1.73 124  124     BUN/Creatinine Ratio 9 - 20 20  20 21  High     Sodium 134 - 144 mmol/L 142  138 138 137 R   Potassium 3.5 - 5.2 mmol/L 4.2  4.1 4.5 3.6 R   Chloride 96 - 106 mmol/L 104  101 100 102 R   Calcium 8.7 - 10.2 mg/dL 9.8  9.8 0.17 9.5 R   Phosphorus 2.8 - 4.1 mg/dL 3.2  3.6 3.5    Total Protein 6.0 - 8.5 g/dL 7.2  7.1 8.2    Albumin 4.3 - 5.2 g/dL 4.7  4.7 R 5.1 R    Globulin, Total 1.5 - 4.5 g/dL 2.5  2.4 3.1    Albumin/Globulin Ratio 1.2 - 2.2 1.9  2.0 1.6    Bilirubin Total 0.0 - 1.2 mg/dL 0.5  0.7 0.6    Alkaline Phosphatase 44 - 121 IU/L 54  57 74 R, CM    LDH 121 - 224 IU/L 126  118 Low  120 Low     AST 0 - 40 IU/L 13  18 12     ALT 0 - 44 IU/L 16  17 13     GGT 0 - 65 IU/L 9  11 11     Iron 38 - 169 ug/dL 49 51.0 High  High     Cholesterol, Total 100 - 199 mg/dL  258    Triglycerides 0 - 149 mg/dL 53  63 76    HDL 527 mg/dL 52  58 48    VLDL Cholesterol Cal 5 - 40 mg/dL 10  12 15     LDL Chol Calc (NIH) 0 - 99 mg/dL 782 High   92 99    Chol/HDL Ratio 0.0 - 5.0 ratio 3.4  2.8 CM 3.4 CM    Comment:  T. Chol/HDL Ratio                                             Men  Women                               1/2 Avg.Risk  3.4    3.3                                    Avg.Risk  5.0    4.4                                2X Avg.Risk  9.6    7.1                                3X Avg.Risk 23.4   11.0  Estimated CHD Risk 0.0 - 1.0 times avg. 0.5   < 0.5 CM 0.5 CM    Comment: The CHD Risk is based on the T. Chol/HDL ratio. Other factors affect CHD Risk such as hypertension, smoking, diabetes, severe obesity, and family history of premature CHD.  TSH 0.450 - 4.500 uIU/mL 2.270  2.450 2.960    T4, Total 4.5 - 12.0 ug/dL 6.7  6.3 6.5    T3 Uptake Ratio 24 - 39 % 30  27 29     Free Thyroxine Index 1.2 - 4.9 2.0  1.7 1.9    WBC 3.4 - 10.8 x10E3/uL 5.8  5.6 6.3  10.1 R  RBC 4.14 - 5.80 x10E6/uL 5.27  5.29 6.20 High   4.95 R  Hemoglobin 13.0 - 17.7 g/dL 15.9  15.7 18.7 High   14.7 R  Hematocrit 37.5 - 51.0 % 46.2  45.7 53.9 High   42.3 R  MCV 79 - 97 fL 88  86 87  85.5 R  MCH 26.6 - 33.0 pg 30.2  29.7 30.2  29.8 R  MCHC 31.5 - 35.7 g/dL 34.4  34.4 34.7  34.8 R  RDW 11.6 - 15.4 % 12.4  12.7 13.1  13.1 R  Platelets 150 - 450 x10E3/uL 273  279 302  281 R  Neutrophils Not Estab. % 43  43 51  46 R  Lymphs Not Estab. % 46  48 39    Monocytes Not Estab. % 7  7 8     Eos Not Estab. % 2  2 1     Basos Not Estab. % 1  0 1    Neutrophils Absolute 1.4 - 7.0 x10E3/uL 2.5  2.4 3.3  4.6 R  Lymphocytes Absolute 0.7 - 3.1 x10E3/uL 2.7  2.6 2.5  4.2 High  R  Monocytes Absolute 0.1 - 0.9 x10E3/uL 0.4  0.4 0.5    EOS (ABSOLUTE) 0.0 - 0.4 x10E3/uL 0.1  0.1 0.0    Basophils Absolute 0.0 - 0.2 x10E3/uL 0.0  0.0 0.0  0.1 R  Immature Granulocytes Not Estab. % 1  0 0                 ___________________________________________  EKG  Left axis deviation consistent with LAFB.  Asymptomatic at 64 bpm. ____________________________________________  ____________________________________________   INITIAL IMPRESSION / ASSESSMENT AND PLAN  As part of my medical decision making, I reviewed the following data within the [facility]         Discussed no acute  findings on physical exam.     ____________________________________________   FINAL CLINICAL IMPRESSION Well exam   ED Discharge Orders     None        Note:  This document was prepared using Dragon voice recognition software and may include unintentional dictation errors.

## 2022-06-29 ENCOUNTER — Other Ambulatory Visit: Payer: Self-pay

## 2022-06-29 ENCOUNTER — Ambulatory Visit
Admission: EM | Admit: 2022-06-29 | Discharge: 2022-06-29 | Disposition: A | Discharge status: Home / Self Care | Payer: 59 | Attending: Family Medicine | Admitting: Family Medicine

## 2022-06-29 DIAGNOSIS — S0502XA Injury of conjunctiva and corneal abrasion without foreign body, left eye, initial encounter: Secondary | ICD-10-CM

## 2022-06-29 MED ORDER — ERYTHROMYCIN 5 MG/GM OP OINT: TOPICAL_OINTMENT | OPHTHALMIC | 0 refills | Status: DC

## 2022-06-29 NOTE — ED Triage Notes (Addendum)
Pt thinks he may have gotten something in his left eye today. Now having burning and irritation of left eye. Flushed eyes with saline and pt unable to relieve irritation. Pt denies difficulty seeing.

## 2022-06-29 NOTE — ED Provider Notes (Signed)
[facility]    CSN: [id number] Arrival date & time: 06/29/22  1920      History   Chief Complaint Chief Complaint  Patient presents with   Eye irritation    HPI HPI  Stanley Powers is a 27 y.o. male.    Stanley Powers presents for left eye pain that started tonight.  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  He also got a hair cut today. He flushed his eye in at home with his mom's contact solution.  Stanley Powers feels like something is still in his eye but does not see anything.   Stanley Powers does not wear glasses or contacts.  Stanley Powers has not had any trouble seeing.  However, eye pain remains.  Stanley Powers has otherwise been well and has no additional concerns today.   Past Medical History:  Diagnosis Date   Broken arm    Orbital fracture [facility])     Patient Active Problem List   Diagnosis Date Noted   Concussion 10/21/2015    Past Surgical History:  Procedure Laterality Date   ORBITAL FRACTURE SURGERY     WISDOM TOOTH EXTRACTION Bilateral 08/01/2016       Home Medications    Prior to Admission medications   Medication Sig Start Date End Date Taking? Authorizing Provider  erythromycin ophthalmic ointment Place a 1/2 inch ribbon of ointment into the lower eyelid 3 times a day for the next week. 06/29/22  Yes Katha Cabal, DO    Family History History reviewed. No pertinent family history.  Social History Social History   Tobacco Use   Smoking status: Never   Smokeless tobacco: Never  Substance Use Topics   Alcohol use: Yes   Drug use: No     Allergies   Patient has no known allergies.   Review of Systems Review of Systems : negative unless otherwise stated in HPI.      Physical Exam Triage Vital Signs ED Triage Vitals [06/29/22 1944]  Enc Vitals Group     BP      Pulse      Resp      Temp      Temp src      SpO2      Weight      Height      Head Circumference      Peak Flow      Pain Score 7     Pain Loc       Pain Edu?      Excl. in GC?    No data found.  Updated Vital Signs BP 124/73   Pulse 86   Temp 99 F (37.2 C)   Resp 20   SpO2 100%   Visual Acuity Right Eye Distance:   Left Eye Distance:   Bilateral Distance: 20/20  Right Eye Near:   Left Eye Near:    Bilateral Near:     Physical Exam  GEN: pleasant well appearing male, in no acute distress  NECK: normal ROM  CV: regular rate  RESP: no increased work of breathing EYES:     General: Lids are normal. Lids are everted, no foreign bodies appreciated. Vision grossly intact. Gaze aligned appropriately.        Right eye: No discharge.        Left eye: No foreign body, discharge or hordeolum.     Extraocular Movements: Extraocular movements intact.     Conjunctiva/sclera:     Left eye: Left conjunctiva  is injected. No chemosis or hemorrhage.    Comments: fluorescein stain performed, Corneal abrasions in the 8 through 10 o'clock position and 3 through 5 o'clock positions, saline rinsed and inspected for foreign bodies  SKIN: warm and dry   UC Treatments / Results  Labs (all labs ordered are listed, but only abnormal results are displayed) Labs Reviewed - No data to display  EKG   Radiology No results found.  Procedures Procedures (including critical care time)  Medications Ordered in UC Medications - No data to display  Initial Impression / Assessment and Plan / UC Course  I have reviewed the triage vital signs and the nursing notes.  Pertinent labs & imaging results that were available during my care of the patient were reviewed by me and considered in my medical decision making (see chart for details).     Patient is a 27 y.o. male who presents after left eye pain.  On exam, he has a evidence of corneal abrasion on the left.  Treat with erythromycin ointment 3 times daily for 7 days.  Advised to follow-up with an ophthalmologist or optometrist, if  discomfort/pain is not improving after 5-7day course.  Recommended pt pick up eye patch from the pharmacy, if desired.  Understanding voiced.  Discussed MDM, treatment plan and plan for follow-up with patient who agrees with plan.  Final Clinical Impressions(s) / UC Diagnoses   Final diagnoses:  Abrasion of left cornea, initial encounter     Discharge Instructions      Stop by the pharmacy to pick up your antibiotic eye medication.  Follow up with your primary eyecare provider or [facility] if symptoms suddenly worsen or you have little improvement in your eye symptoms.        ED Prescriptions     Medication Sig Dispense Auth. Provider   erythromycin ophthalmic ointment Place a 1/2 inch ribbon of ointment into the lower eyelid 3 times a day for the next week. 3.5 g Katha Cabal, DO      PDMP not reviewed this encounter.   Katha Cabal, DO 07/04/22 0321

## 2022-06-29 NOTE — Discharge Instructions (Addendum)
Stop by the pharmacy to pick up your antibiotic eye medication.  Follow up with your primary eyecare provider or [facility] if symptoms suddenly worsen or you have little improvement in your eye symptoms.

## 2023-01-06 ENCOUNTER — Ambulatory Visit: Payer: Self-pay

## 2023-01-06 DIAGNOSIS — Z0289 Encounter for other administrative examinations: Secondary | ICD-10-CM

## 2023-01-06 LAB — POCT URINALYSIS DIPSTICK
Bilirubin, UA: NEGATIVE
Blood, UA: NEGATIVE
Glucose, UA: NEGATIVE
Ketones, UA: NEGATIVE
Leukocytes, UA: NEGATIVE
Nitrite, UA: NEGATIVE
Protein, UA: NEGATIVE
Spec Grav, UA: <=1.005 — AB (ref 1.010–1.025)
Urobilinogen, UA: 0.2 U/dL
pH, UA: 6 (ref 5.0–8.0)

## 2023-01-06 NOTE — Progress Notes (Signed)
Pt completed labs for FF physical./CL,RMA 

## 2023-01-07 LAB — CMP12+LP+TP+TSH+6AC+CBC/D/PLT
ALT: 19 [IU]/L (ref 0–44)
AST: 17 [IU]/L (ref 0–40)
Albumin: 4.8 g/dL (ref 4.3–5.2)
Alkaline Phosphatase: 64 [IU]/L (ref 44–121)
BUN/Creatinine Ratio: 17 (ref 9–20)
BUN: 15 mg/dL (ref 6–20)
Basophils Absolute: 0 10*3/uL (ref 0.0–0.2)
Basos: 1 %
Bilirubin Total: 0.5 mg/dL (ref 0.0–1.2)
Calcium: 10.1 mg/dL (ref 8.7–10.2)
Chloride: 105 mmol/L (ref 96–106)
Chol/HDL Ratio: 3.2 {ratio} (ref 0.0–5.0)
Cholesterol, Total: 188 mg/dL (ref 100–199)
Creatinine, Ser: 0.86 mg/dL (ref 0.76–1.27)
EOS (ABSOLUTE): 0.1 10*3/uL (ref 0.0–0.4)
Eos: 1 %
Estimated CHD Risk: <0.5 times avg. (ref 0.0–1.0)
Free Thyroxine Index: 2 (ref 1.2–4.9)
GGT: 13 [IU]/L (ref 0–65)
Globulin, Total: 2.7 g/dL (ref 1.5–4.5)
Glucose: 92 mg/dL (ref 70–99)
HDL: 58 mg/dL (ref >39)
Hematocrit: 47.2 % (ref 37.5–51.0)
Hemoglobin: 16 g/dL (ref 13.0–17.7)
Immature Grans (Abs): 0 10*3/uL (ref 0.0–0.1)
Immature Granulocytes: 0 %
Iron: 129 ug/dL (ref 38–169)
LDH: 131 [IU]/L (ref 121–224)
LDL Chol Calc (NIH): 121 mg/dL — ABNORMAL HIGH (ref 0–99)
Lymphocytes Absolute: 2.6 10*3/uL (ref 0.7–3.1)
Lymphs: 40 %
MCH: 29.6 pg (ref 26.6–33.0)
MCHC: 33.9 g/dL (ref 31.5–35.7)
MCV: 87 fL (ref 79–97)
Monocytes Absolute: 0.4 10*3/uL (ref 0.1–0.9)
Monocytes: 6 %
Neutrophils Absolute: 3.4 10*3/uL (ref 1.4–7.0)
Neutrophils: 52 %
Phosphorus: 3.6 mg/dL (ref 2.8–4.1)
Platelets: 306 10*3/uL (ref 150–450)
Potassium: 4.1 mmol/L (ref 3.5–5.2)
RBC: 5.4 x10E6/uL (ref 4.14–5.80)
RDW: 12.3 % (ref 11.6–15.4)
Sodium: 141 mmol/L (ref 134–144)
T3 Uptake Ratio: 26 % (ref 24–39)
T4, Total: 7.6 ug/dL (ref 4.5–12.0)
TSH: 3.18 u[IU]/mL (ref 0.450–4.500)
Total Protein: 7.5 g/dL (ref 6.0–8.5)
Triglycerides: 44 mg/dL (ref 0–149)
Uric Acid: 4.9 mg/dL (ref 3.8–8.4)
VLDL Cholesterol Cal: 9 mg/dL (ref 5–40)
WBC: 6.4 10*3/uL (ref 3.4–10.8)
eGFR: 122 mL/min/{1.73_m2} (ref >59)

## 2023-01-10 ENCOUNTER — Encounter: Payer: Self-pay | Admitting: Physician Assistant

## 2023-01-10 ENCOUNTER — Ambulatory Visit: Payer: Self-pay | Admitting: Physician Assistant

## 2023-01-10 VITALS — BP 107/74 | HR 76 | Resp 16 | Ht 74.0 in | Wt 185.0 lb

## 2023-01-10 DIAGNOSIS — Z0289 Encounter for other administrative examinations: Secondary | ICD-10-CM

## 2023-01-10 NOTE — Progress Notes (Signed)
[facility] ____________________________________________   None    (approximate)  I have reviewed the triage vital signs and the nursing notes.   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  Patient was no concerns or complaints.        { Past Medical History:  Diagnosis Date   Broken arm    Orbital fracture [facility])     Patient Active Problem List   Diagnosis Date Noted   Concussion 10/21/2015    Past Surgical History:  Procedure Laterality Date   ORBITAL FRACTURE SURGERY     WISDOM TOOTH EXTRACTION Bilateral 08/01/2016    Prior to Admission medications   Medication Sig Start Date End Date Taking? Authorizing Provider  erythromycin ophthalmic ointment Place a 1/2 inch ribbon of ointment into the lower eyelid 3 times a day for the next week. 06/29/22   Katha Cabal, DO    Allergies Patient has no known allergies.  No family history on file.  Social History Social History   Tobacco Use   Smoking status: Never   Smokeless tobacco: Never  Substance Use Topics   Alcohol use: Yes   Drug use: No    Review of Systems Constitutional: No fever/chills Eyes: No visual changes. ENT: No sore throat. Cardiovascular: Denies chest pain. Respiratory: Denies shortness of breath. Gastrointestinal: No abdominal pain.  No nausea, no vomiting.  No diarrhea.  No constipation. Genitourinary: Negative for dysuria. Musculoskeletal: Negative for back pain. Skin: Negative for rash. Neurological: Negative for headaches, focal weakness or numbness. ____________________________________________   PHYSICAL EXAM:  VITAL SIGNS: BP 107/74  Pulse 76  Resp 16  SpO2 99 %  Weight 185 lb (83.9 kg)  Height 6\' 2"  (1.88 m)   BMI 23.75 kg/m2  BSA 2.09 m2   Constitutional: Alert and oriented. Well appearing and in no acute distress. Eyes:  Conjunctivae are normal. PERRL. EOMI. Head: Atraumatic. Nose: No congestion/rhinnorhea. Mouth/Throat: Mucous membranes are moist.  Oropharynx non-erythematous. Neck: No stridor.  No cervical spine tenderness to palpation. Hematological/Lymphatic/Immunilogical: No cervical lymphadenopathy. Cardiovascular: Normal rate, regular rhythm. Grossly normal heart sounds.  Good peripheral circulation. Respiratory: Normal respiratory effort.  No retractions. Lungs CTAB. Gastrointestinal: Soft and nontender. No distention. No abdominal bruits. No CVA tenderness. Genitourinary: Deferred Musculoskeletal: No lower extremity tenderness nor edema.  No joint effusions. Neurologic:  Normal speech and language. No gross focal neurologic deficits are appreciated. No gait instability. Skin:  Skin is warm, dry and intact. No rash noted. Psychiatric: Mood and affect are normal. Speech and behavior are normal.  ____________________________________________   LABS   Contains abnormal data CMP12+LP+TP+TSH+6AC+CBC/D/Plt Order: [id number] Status: Final result     Visible to patient: Yes (seen)     Next appt: None     Dx: Encounter for physical examination re...   0 Result Notes          Component Ref Range & Units 4 d ago (01/06/23) 10 mo ago (02/22/22) 1 yr ago (03/08/21) 1 yr ago (03/02/21) 3 yr ago (10/03/19) 6 yr ago (08/11/16) 6 yr ago (08/11/16)  Glucose 70 - 99 mg/dL 92 90  93 82 R 94 R   Uric Acid 3.8 - 8.4 mg/dL 4.9 4.5 CM  5.1 CM 4.4 CM    Comment:            Therapeutic target for gout patients: <6.0  BUN 6 - 20  mg/dL 15 16  16 19 24  High    Creatinine, Ser 0.76 - 1.27 mg/dL 0.98 1.19  1.47 8.29 5.62 R   eGFR >59 mL/min/1.73 122 124  124     BUN/Creatinine Ratio 9 - 20 17 20  20 21  High     Sodium 134 - 144 mmol/L 141 142  138 138 137 R   Potassium 3.5 - 5.2 mmol/L 4.1 4.2  4.1 4.5 3.6 R   Chloride 96 - 106 mmol/L 105 104  101 100 102 R   Calcium 8.7 - 10.2 mg/dL 13.0 9.8  9.8 86.5 9.5 R    Phosphorus 2.8 - 4.1 mg/dL 3.6 3.2  3.6 3.5    Total Protein 6.0 - 8.5 g/dL 7.5 7.2  7.1 8.2    Albumin 4.3 - 5.2 g/dL 4.8 4.7  4.7 R 5.1 R    Globulin, Total 1.5 - 4.5 g/dL 2.7 2.5  2.4 3.1    Bilirubin Total 0.0 - 1.2 mg/dL 0.5 0.5  0.7 0.6    Alkaline Phosphatase 44 - 121 IU/L 64 54  57 74 R, CM    LDH 121 - 224 IU/L 131 126  118 Low  120 Low     AST 0 - 40 IU/L 17 13  18 12     ALT 0 - 44 IU/L 19 16  17 13     GGT 0 - 65 IU/L 13 9  11 11     Iron 38 - 169 ug/dL 784 696 49 295 High  284 High     Cholesterol, Total 100 - 199 mg/dL 132 440  102 725    Triglycerides 0 - 149 mg/dL 44 53  63 76    HDL >36 mg/dL 58 52  58 48    VLDL Cholesterol Cal 5 - 40 mg/dL 9 10  12 15     LDL Chol Calc (NIH) 0 - 99 mg/dL 644 High  034 High   92 99    Chol/HDL Ratio 0.0 - 5.0 ratio 3.2 3.4 CM  2.8 CM 3.4 CM    Comment:                                   T. Chol/HDL Ratio                                             Men  Women                               1/2 Avg.Risk  3.4    3.3                                   Avg.Risk  5.0    4.4                                2X Avg.Risk  9.6    7.1                                3X Avg.Risk 23.4   11.0  Estimated  CHD Risk 0.0 - 1.0 times avg.  < 0.5 0.5 CM   < 0.5 CM 0.5 CM    Comment: The CHD Risk is based on the T. Chol/HDL ratio. Other factors affect CHD Risk such as hypertension, smoking, diabetes, severe obesity, and family history of premature CHD.  TSH 0.450 - 4.500 uIU/mL 3.180 2.270  2.450 2.960    T4, Total 4.5 - 12.0 ug/dL 7.6 6.7  6.3 6.5    T3 Uptake Ratio 24 - 39 % 26 30  27 29     Free Thyroxine Index 1.2 - 4.9 2.0 2.0  1.7 1.9    WBC 3.4 - 10.8 x10E3/uL 6.4 5.8  5.6 6.3  10.1 R  RBC 4.14 - 5.80 x10E6/uL 5.40 5.27  5.29 6.20 High   4.95 R  Hemoglobin 13.0 - 17.7 g/dL 16.1 09.6  04.5 40.9 High   14.7 R  Hematocrit 37.5 - 51.0 % 47.2 46.2  45.7 53.9 High   42.3 R  MCV 79 - 97 fL 87 88  86 87  85.5 R  MCH 26.6 - 33.0 pg  29.6 30.2  29.7 30.2  29.8 R  MCHC 31.5 - 35.7 g/dL 81.1 91.4  78.2 95.6  21.3 R  RDW 11.6 - 15.4 % 12.3 12.4  12.7 13.1  13.1 R  Platelets 150 - 450 x10E3/uL 306 273  279 302  281 R  Neutrophils Not Estab. % 52 43  43 51  46 R  Lymphs Not Estab. % 40 46  48 39    Monocytes Not Estab. % 6 7  7 8     Eos Not Estab. % 1 2  2 1     Basos Not Estab. % 1 1  0 1    Neutrophils Absolute 1.4 - 7.0 x10E3/uL 3.4 2.5  2.4 3.3  4.6 R  Lymphocytes Absolute 0.7 - 3.1 x10E3/uL 2.6 2.7  2.6 2.5  4.2 High  R  Monocytes Absolute 0.1 - 0.9 x10E3/uL 0.4 0.4  0.4 0.5    EOS (ABSOLUTE) 0.0 - 0.4 x10E3/uL 0.1 0.1  0.1 0.0    Basophils Absolute 0.0 - 0.2 x10E3/uL 0.0 0.0  0.0 0.0  0.1 R  Immature Granulocytes Not Estab. % 0 1  0 0    Immature Grans (Abs)           ____________________________________________  EKG  Sinus rhythm at 67 bpm ____________________________________________    ____________________________________________   INITIAL IMPRESSION / ASSESSMENT AND PLAN    As part of my medical decision making, I reviewed the following data within the electronic MEDICAL RECORD NUMBER      No acute findings on physical exam, EKG, or labs.        ____________________________________________   FINAL CLINICAL IMPRESSION  Well exam   ED Discharge Orders     None        Note:  This document was prepared using Dragon voice recognition software and may include unintentional dictation errors.

## 2023-01-10 NOTE — Progress Notes (Signed)
Pt presents today to complete physical, Pt didn't voice any concerns at this time Gretel Acre

## 2023-06-19 ENCOUNTER — Other Ambulatory Visit: Payer: Self-pay

## 2023-06-19 DIAGNOSIS — Z0289 Encounter for other administrative examinations: Secondary | ICD-10-CM

## 2023-06-19 NOTE — Progress Notes (Signed)
 Completed random UDS and BAT per [employer] policy after consents signed.

## 2024-02-09 ENCOUNTER — Encounter: Payer: Self-pay | Admitting: Emergency Medicine

## 2024-02-09 ENCOUNTER — Ambulatory Visit
Admission: EM | Admit: 2024-02-09 | Discharge: 2024-02-09 | Disposition: A | Discharge status: Home / Self Care | Attending: Family Medicine | Admitting: Family Medicine

## 2024-02-09 DIAGNOSIS — J029 Acute pharyngitis, unspecified: Secondary | ICD-10-CM | POA: Diagnosis present

## 2024-02-09 DIAGNOSIS — J111 Influenza due to unidentified influenza virus with other respiratory manifestations: Secondary | ICD-10-CM | POA: Insufficient documentation

## 2024-02-09 LAB — POCT RAPID STREP A (OFFICE): Rapid Strep A Screen: NEGATIVE

## 2024-02-09 MED ORDER — PROMETHAZINE-DM 6.25-15 MG/5ML PO SYRP: 5.0000 mL | ORAL_SOLUTION | Freq: Four times a day (QID) | ORAL | 0 refills | Status: DC | PRN

## 2024-02-09 MED ORDER — IPRATROPIUM BROMIDE 0.06 % NA SOLN: 2.0000 | Freq: Four times a day (QID) | NASAL | 12 refills | Status: DC

## 2024-02-09 MED ORDER — BENZONATATE 100 MG PO CAPS: 200.0000 mg | ORAL_CAPSULE | Freq: Three times a day (TID) | ORAL | 0 refills | Status: DC

## 2024-02-09 NOTE — ED Provider Notes (Signed)
 " [facility]    CSN: [id number] Arrival date & time: 02/09/24  1352      History   Chief Complaint Chief Complaint  Patient presents with   Nasal Congestion    HPI Stanley Powers is a 29 y.o. male.   HPI  29 year old male with past medical history significant for orbital fracture, fractured arm, and concussion presents for evaluation of 3 to 4 days with flulike symptoms which include nasal congestion, sore throat, nonproductive cough.  No fever.  Past Medical History:  Diagnosis Date   Broken arm    Orbital fracture [facility])     Patient Active Problem List   Diagnosis Date Noted   Concussion 10/21/2015    Past Surgical History:  Procedure Laterality Date   ORBITAL FRACTURE SURGERY     WISDOM TOOTH EXTRACTION Bilateral 08/01/2016       Home Medications    Prior to Admission medications  Medication Sig Start Date End Date Taking? Authorizing Provider  benzonatate  (TESSALON ) 100 MG capsule Take 2 capsules (200 mg total) by mouth every 8 (eight) hours. 02/09/24  Yes Bernardino Ditch, NP  ipratropium (ATROVENT ) 0.06 % nasal spray Place 2 sprays into both nostrils 4 (four) times daily. 02/09/24  Yes Bernardino Ditch, NP  promethazine -dextromethorphan (PROMETHAZINE -DM) 6.25-15 MG/5ML syrup Take 5 mLs by mouth 4 (four) times daily as needed. 02/09/24  Yes Bernardino Ditch, NP    Family History History reviewed. No pertinent family history.  Social History Social History[1]   Allergies   Patient has no known allergies.   Review of Systems Review of Systems  Constitutional:  Negative for fever.  HENT:  Positive for congestion, rhinorrhea and sore throat. Negative for ear pain.   Respiratory:  Positive for cough. Negative for shortness of breath and wheezing.      Physical Exam Triage Vital Signs ED Triage Vitals  Encounter Vitals Group     BP      Girls Systolic BP Percentile      Girls Diastolic BP Percentile      Boys Systolic BP Percentile      Boys  Diastolic BP Percentile      Pulse      Resp      Temp      Temp src      SpO2      Weight      Height      Head Circumference      Peak Flow      Pain Score      Pain Loc      Pain Education      Exclude from Growth Chart    No data found.  Updated Vital Signs BP 115/79 (BP Location: Right Arm)   Pulse 68   Temp 98.2 F (36.8 C) (Oral)   Resp 16   Ht 6' 2 (1.88 m)   Wt 184 lb 15.5 oz (83.9 kg)   SpO2 95%   BMI 23.75 kg/m   Visual Acuity Right Eye Distance:   Left Eye Distance:   Bilateral Distance:    Right Eye Near:   Left Eye Near:    Bilateral Near:     Physical Exam Vitals and nursing note reviewed.  Constitutional:      Appearance: Normal appearance. He is not ill-appearing.  HENT:     Head: Normocephalic and atraumatic.     Right Ear: Tympanic membrane, ear canal and external ear normal. There is no impacted cerumen.  Left Ear: Tympanic membrane, ear canal and external ear normal. There is no impacted cerumen.     Nose: Congestion and rhinorrhea present.     Comments: Nasal mucosa is erythematous and mildly edematous with scant clear discharge in both nares.    Mouth/Throat:     Mouth: Mucous membranes are moist.     Pharynx: Oropharynx is clear. Posterior oropharyngeal erythema present. No oropharyngeal exudate.     Comments: Tonsillar pillars are 2+ edematous and erythematous but free of exudate. Cardiovascular:     Rate and Rhythm: Normal rate and regular rhythm.     Pulses: Normal pulses.     Heart sounds: Normal heart sounds. No murmur heard.    No friction rub. No gallop.  Pulmonary:     Effort: Pulmonary effort is normal.     Breath sounds: Normal breath sounds. No wheezing, rhonchi or rales.  Musculoskeletal:     Cervical back: Normal range of motion and neck supple. No tenderness.  Lymphadenopathy:     Cervical: No cervical adenopathy.  Skin:    General: Skin is warm and dry.     Capillary Refill: Capillary refill takes less than 2  seconds.     Findings: No rash.  Neurological:     General: No focal deficit present.     Mental Status: He is alert and oriented to person, place, and time.      UC Treatments / Results  Labs (all labs ordered are listed, but only abnormal results are displayed) Labs Reviewed  POCT RAPID STREP A (OFFICE) - Normal  CULTURE, GROUP A STREP [facility])    EKG   Radiology No results found.  Procedures Procedures (including critical care time)  Medications Ordered in UC Medications - No data to display  Initial Impression / Assessment and Plan / UC Course  I have reviewed the triage vital signs and the nursing notes.  Pertinent labs & imaging results that were available during my care of the patient were reviewed by me and considered in my medical decision making (see chart for details).   Patient is a pleasant, though mildly ill-appearing, 29 year old male presenting for evaluation of flulike symptoms.  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  He is complaining of nasal congestion without much in the way of nasal discharge and a nonproductive cough.  He is also endorsing a sore throat.  He has markedly edematous and erythematous tonsillar pillars but no visible exudate.  I will order a rapid strep.  I will not test for influenza at this time given that he is on day 3-4 of symptoms and is outside the therapeutic window for Tamiflu.  Rapid strep is negative.  I will send swab for culture.  I will discharge patient home with diagnosis of influenza-like illness.  He is outside the therapeutic window for Tamiflu but I will prescribe Atrovent  nasal spray for his congestion, Tessalon  Perles and Promethazine  DM cough syrup for cough and congestion.  He may use salt water gargles, over-the-counter Chloraseptic or Sucrets lozenges, and Tylenol and/or ibuprofen for his sore  throat pain.  Return precautions reviewed.  Work note provided.   Final Clinical Impressions(s) / UC Diagnoses   Final diagnoses:  Acute pharyngitis, unspecified etiology  Influenza-like illness     Discharge Instructions      Your strep test was negative but we are  sending a swab for culture.  Your symptoms are most consistent with influenza but you are outside the therapeutic window for antivirals.  Use over-the-counter Tylenol and/or ibuprofen according to the package instructions as needed for any fever or pain.  You may gargle with warm salt water as often as you like help soothe your throat.  You may also use over-the-counter Chloraseptic or Sucrets lozenges.  No more than 1 lozenge every 2 hours as the menthol may give you diarrhea.  Use the Atrovent  nasal spray, 2 squirts up each nostril every 6 hours, as needed for nasal congestion and runny nose.  Use over-the-counter Delsym, Zarbee's, or Robitussin during the day as needed for cough.  Use the Tessalon  Perles every 8 hours as needed for cough.  Taken with a small sip of water.  You may experience some numbness to your tongue or metallic taste in your mouth, this is normal.  Use the Promethazine  DM cough syrup at bedtime as will make you drowsy but it should help dry up your postnasal drip and aid you in sleep and cough relief.  Return for reevaluation, or see your primary care provider, for new or worsening symptoms.      ED Prescriptions     Medication Sig Dispense Auth. Provider   benzonatate  (TESSALON ) 100 MG capsule Take 2 capsules (200 mg total) by mouth every 8 (eight) hours. 21 capsule Bernardino Ditch, NP   ipratropium (ATROVENT ) 0.06 % nasal spray Place 2 sprays into both nostrils 4 (four) times daily. 15 mL Bernardino Ditch, NP   promethazine -dextromethorphan (PROMETHAZINE -DM) 6.25-15 MG/5ML syrup Take 5 mLs by mouth 4 (four) times daily as needed. 118 mL Bernardino Ditch, NP      PDMP not reviewed this  encounter.     [1]  Social History Tobacco Use   Smoking status: Never   Smokeless tobacco: Never  Substance Use Topics   Alcohol use: Yes   Drug use: No     Bernardino Ditch, NP 02/09/24 1429  "

## 2024-02-09 NOTE — Discharge Instructions (Signed)
 Your strep test was negative but we are sending a swab for culture.  Your symptoms are most consistent with influenza but you are outside the therapeutic window for antivirals.  Use over-the-counter Tylenol and/or ibuprofen according to the package instructions as needed for any fever or pain.  You may gargle with warm salt water as often as you like help soothe your throat.  You may also use over-the-counter Chloraseptic or Sucrets lozenges.  No more than 1 lozenge every 2 hours as the menthol may give you diarrhea.  Use the Atrovent  nasal spray, 2 squirts up each nostril every 6 hours, as needed for nasal congestion and runny nose.  Use over-the-counter Delsym, Zarbee's, or Robitussin during the day as needed for cough.  Use the Tessalon  Perles every 8 hours as needed for cough.  Taken with a small sip of water.  You may experience some numbness to your tongue or metallic taste in your mouth, this is normal.  Use the Promethazine  DM cough syrup at bedtime as will make you drowsy but it should help dry up your postnasal drip and aid you in sleep and cough relief.  Return for reevaluation, or see your primary care provider, for new or worsening symptoms.

## 2024-02-09 NOTE — ED Triage Notes (Signed)
 Pt c/o nasal congestion, sore throat, chest congestion, cough. Started about 3-4 days ago. Denies fever.

## 2024-02-12 ENCOUNTER — Encounter: Payer: Self-pay | Admitting: Physician Assistant

## 2024-02-12 ENCOUNTER — Ambulatory Visit (HOSPITAL_COMMUNITY): Payer: Self-pay

## 2024-02-12 LAB — CULTURE, GROUP A STREP (THRC)

## 2024-02-16 ENCOUNTER — Ambulatory Visit: Payer: Self-pay

## 2024-02-16 DIAGNOSIS — Z0289 Encounter for other administrative examinations: Secondary | ICD-10-CM

## 2024-02-16 LAB — POCT URINALYSIS DIPSTICK
Bilirubin, UA: NEGATIVE
Glucose, UA: NEGATIVE
Ketones, UA: NEGATIVE
Leukocytes, UA: NEGATIVE
Nitrite, UA: NEGATIVE
Protein, UA: NEGATIVE
Spec Grav, UA: 1.02
Urobilinogen, UA: 0.2 U/dL
pH, UA: 6

## 2024-02-16 NOTE — Progress Notes (Signed)
 Pt completed labs for physical. Gretel Acre

## 2024-02-17 LAB — CMP12+LP+TP+TSH+6AC+CBC/D/PLT
ALT: 16 IU/L (ref 0–44)
AST: 16 IU/L (ref 0–40)
Albumin: 4.6 g/dL (ref 4.3–5.2)
Alkaline Phosphatase: 61 IU/L (ref 47–123)
BUN/Creatinine Ratio: 21 — ABNORMAL HIGH (ref 9–20)
BUN: 17 mg/dL (ref 6–20)
Basophils Absolute: 0 x10E3/uL (ref 0.0–0.2)
Basos: 1 %
Bilirubin Total: 0.5 mg/dL (ref 0.0–1.2)
Calcium: 10 mg/dL (ref 8.7–10.2)
Chloride: 101 mmol/L (ref 96–106)
Chol/HDL Ratio: 3.8 ratio (ref 0.0–5.0)
Cholesterol, Total: 158 mg/dL (ref 100–199)
Creatinine, Ser: 0.81 mg/dL (ref 0.76–1.27)
EOS (ABSOLUTE): 0.1 x10E3/uL (ref 0.0–0.4)
Eos: 1 %
Estimated CHD Risk: 0.7 times avg. (ref 0.0–1.0)
Free Thyroxine Index: 2.4 (ref 1.2–4.9)
GGT: 10 IU/L (ref 0–65)
Globulin, Total: 2.7 g/dL (ref 1.5–4.5)
Glucose: 83 mg/dL (ref 70–99)
HDL: 42 mg/dL
Hematocrit: 49 % (ref 37.5–51.0)
Hemoglobin: 16.2 g/dL (ref 13.0–17.7)
Immature Grans (Abs): 0 x10E3/uL (ref 0.0–0.1)
Immature Granulocytes: 0 %
Iron: 143 ug/dL (ref 38–169)
LDH: 132 IU/L (ref 121–224)
LDL Chol Calc (NIH): 105 mg/dL — ABNORMAL HIGH (ref 0–99)
Lymphocytes Absolute: 2.5 x10E3/uL (ref 0.7–3.1)
Lymphs: 41 %
MCH: 29.4 pg (ref 26.6–33.0)
MCHC: 33.1 g/dL (ref 31.5–35.7)
MCV: 89 fL (ref 79–97)
Monocytes Absolute: 0.5 x10E3/uL (ref 0.1–0.9)
Monocytes: 9 %
Neutrophils Absolute: 2.9 x10E3/uL (ref 1.4–7.0)
Neutrophils: 48 %
Phosphorus: 3.1 mg/dL (ref 2.8–4.1)
Platelets: 415 x10E3/uL (ref 150–450)
Potassium: 4.4 mmol/L (ref 3.5–5.2)
RBC: 5.51 x10E6/uL (ref 4.14–5.80)
RDW: 12.2 % (ref 11.6–15.4)
Sodium: 138 mmol/L (ref 134–144)
T3 Uptake Ratio: 30 % (ref 24–39)
T4, Total: 7.9 ug/dL (ref 4.5–12.0)
TSH: 2.77 u[IU]/mL (ref 0.450–4.500)
Total Protein: 7.3 g/dL (ref 6.0–8.5)
Triglycerides: 55 mg/dL (ref 0–149)
Uric Acid: 5.1 mg/dL (ref 3.8–8.4)
VLDL Cholesterol Cal: 11 mg/dL (ref 5–40)
WBC: 6.1 x10E3/uL (ref 3.4–10.8)
eGFR: 122 mL/min/1.73

## 2024-02-21 ENCOUNTER — Ambulatory Visit: Payer: Self-pay | Admitting: Physician Assistant

## 2024-02-21 ENCOUNTER — Encounter: Payer: Self-pay | Admitting: Physician Assistant

## 2024-02-21 VITALS — BP 131/78 | HR 66 | Temp 97.8°F | Resp 14 | Ht 74.0 in | Wt 193.0 lb

## 2024-02-21 DIAGNOSIS — Z0289 Encounter for other administrative examinations: Secondary | ICD-10-CM

## 2024-02-21 LAB — POCT URINALYSIS DIPSTICK
Bilirubin, UA: NEGATIVE
Blood, UA: NEGATIVE
Glucose, UA: NEGATIVE
Ketones, UA: NEGATIVE
Leukocytes, UA: NEGATIVE
Nitrite, UA: NEGATIVE
Protein, UA: NEGATIVE
Spec Grav, UA: 1.01
Urobilinogen, UA: 0.2 U/dL
pH, UA: 6.5

## 2024-02-21 NOTE — Progress Notes (Signed)
 "  [facility] [place] [facility]   None    (approximate)  I have reviewed the triage vital signs and the nursing notes.   HISTORY  Chief Complaint No chief complaint on file.   HPI Stanley Powers is a 29 y.o. male patient presents for annual physical exam for the [employer] [facility] [employer].  Patient voices concern for O2 saturation when sleeping.  Patient states his watch is recording O2 sats in the mid 80s every night.    Patient states  is single lives alone so is not sure of any snoring or apnea.      Past Medical History:  Diagnosis Date   Broken arm    Orbital fracture [facility])     Patient Active Problem List   Diagnosis Date Noted   Concussion 10/21/2015    Past Surgical History:  Procedure Laterality Date   ORBITAL FRACTURE SURGERY     WISDOM TOOTH EXTRACTION Bilateral 08/01/2016    Prior to Admission medications  Medication Sig Start Date End Date Taking? Authorizing Provider  benzonatate  (TESSALON ) 100 MG capsule Take 2 capsules (200 mg total) by mouth every 8 (eight) hours. 02/09/24   Bernardino Ditch, NP  ipratropium (ATROVENT ) 0.06 % nasal spray Place 2 sprays into both nostrils 4 (four) times daily. 02/09/24   Bernardino Ditch, NP  promethazine -dextromethorphan (PROMETHAZINE -DM) 6.25-15 MG/5ML syrup Take 5 mLs by mouth 4 (four) times daily as needed. 02/09/24   Bernardino Ditch, NP    Allergies Patient has no known allergies.  No family history on file.  Social History Social History[1]  Review of Systems Constitutional: No fever/chills Eyes: No visual changes. ENT: No sore throat. Cardiovascular: Denies chest pain. Respiratory: Denies shortness of breath. Gastrointestinal: No abdominal pain.  No nausea, no vomiting.  No diarrhea.  No constipation. Genitourinary: Negative for dysuria. Musculoskeletal: Negative for back pain. Skin: Negative for rash. Neurological: Negative for headaches, focal weakness or  numbness.  ____________________________________________   PHYSICAL EXAM:  VITAL SIGNS: BP 131/78  BP Location Left Arm  Patient Position Sitting  Pulse Rate 66  Temp 97.8 F (36.6 C)  Weight 193 lb (87.5 kg)  Height 6' 2 (1.88 m)  Resp 14  SpO2 100 %   BMI: 24.78 kg/m2  BSA: 2.14 m2   Constitutional: Alert and oriented. Well appearing and in no acute distress. Eyes: Conjunctivae are normal. PERRL. EOMI. Head: Atraumatic. Nose: No congestion/rhinnorhea. Mouth/Throat: Mucous membranes are moist.  Oropharynx non-erythematous. Neck: No stridor. No cervical spine tenderness to palpation. Hematological/Lymphatic/Immunilogical: No cervical lymphadenopathy. Cardiovascular: Normal rate, regular rhythm. Grossly normal heart sounds.  Good peripheral circulation. Respiratory: Normal respiratory effort.  No retractions. Lungs CTAB. Gastrointestinal: Soft and nontender. No distention. No abdominal bruits. No CVA tenderness. Genitourinary: Deferred Musculoskeletal: No lower extremity tenderness nor edema.  No joint effusions. Neurologic:  Normal speech and language. No gross focal neurologic deficits are appreciated. No gait instability. Skin:  Skin is warm, dry and intact. No rash noted. Psychiatric: Mood and affect are normal. Speech and behavior are normal.  ____________________________________________   LABS            Component Ref Range & Units (hover) 5 d ago (02/16/24) 1 yr ago (01/06/23) 1 yr ago (02/22/22) 2 yr ago (03/08/21) 2 yr ago (03/02/21) 4 yr ago (10/03/19) 7 yr ago (08/11/16) 7 yr ago (08/11/16)  Glucose 83 92 90  93 82 R 94 R   Uric Acid 5.1 4.9 CM 4.5 CM  5.1 CM  4.4 CM    Comment:            Therapeutic target for gout patients: <6.0  BUN 17 15 16  16 19 24  High    Creatinine, Ser 0.81 0.86 0.80  0.82 0.91 1.00 R   eGFR 122 122 124  124     BUN/Creatinine Ratio 21 High  17 20  20 21  High     Sodium 138 141 142  138 138 137 R   Potassium 4.4 4.1 4.2  4.1 4.5  3.6 R   Chloride 101 105 104  101 100 102 R   Calcium 10.0 10.1 9.8  9.8 10.1 9.5 R   Phosphorus 3.1 3.6 3.2  3.6 3.5    Total Protein 7.3 7.5 7.2  7.1 8.2    Albumin 4.6 4.8 4.7  4.7 R 5.1 R    Globulin, Total 2.7 2.7 2.5  2.4 3.1    Bilirubin Total 0.5 0.5 0.5  0.7 0.6    Alkaline Phosphatase 61 64 R 54 R  57 R 74 R, CM    LDH 132 131 126  118 Low  120 Low     AST 16 17 13  18 12     ALT 16 19 16  17 13     GGT 10 13 9  11 11     Iron 143 129 167 49 245 High  189 High     Cholesterol, Total 158 188 179  162 162    Triglycerides 55 44 53  63 76    HDL 42 58 52  58 48    VLDL Cholesterol Cal 11 9 10  12 15     LDL Chol Calc (NIH) 105 High  121 High  117 High   92 99    Chol/HDL Ratio 3.8 3.2 CM 3.4 CM  2.8 CM 3.4 CM    Comment:                                   T. Chol/HDL Ratio                                             Men  Women                               1/2 Avg.Risk  3.4    3.3                                   Avg.Risk  5.0    4.4                                2X Avg.Risk  9.6    7.1                                3X Avg.Risk 23.4   11.0  Estimated CHD Risk 0.7  < 0.5 CM 0.5 CM   < 0.5 CM 0.5 CM    Comment: The CHD Risk is based on the T. Chol/HDL ratio. Other factors affect CHD  Risk such as hypertension, smoking, diabetes, severe obesity, and family history of premature CHD.  TSH 2.770 3.180 2.270  2.450 2.960    T4, Total 7.9 7.6 6.7  6.3 6.5    T3 Uptake Ratio 30 26 30  27 29     Free Thyroxine Index 2.4 2.0 2.0  1.7 1.9    WBC 6.1 6.4 5.8  5.6 6.3  10.1 R  RBC 5.51 5.40 5.27  5.29 6.20 High   4.95 R  Hemoglobin 16.2 16.0 15.9  15.7 18.7 High   14.7 R  Hematocrit 49.0 47.2 46.2  45.7 53.9 High   42.3 R  MCV 89 87 88  86 87  85.5 R  MCH 29.4 29.6 30.2  29.7 30.2  29.8 R  MCHC 33.1 33.9 34.4  34.4 34.7  34.8 R  RDW 12.2 12.3 12.4  12.7 13.1  13.1 R  Platelets 415 306 273  279 302  281 R  Neutrophils 48 52 43  43 51  46 R  Lymphs 41 40 46  48 39    Monocytes 9 6 7   7 8     Eos 1 1 2  2 1     Basos 1 1 1   0 1    Neutrophils Absolute 2.9 3.4 2.5  2.4 3.3  4.6 R  Lymphocytes Absolute 2.5 2.6 2.7  2.6 2.5  4.2 High  R  Monocytes Absolute 0.5 0.4 0.4  0.4 0.5    EOS (ABSOLUTE) 0.1 0.1 0.1  0.1 0.0    Basophils Absolute 0.0 0.0 0.0  0.0 0.0  0.1 R  Immature Granulocytes 0 0 1  0 0    Immature Grans (Abs) 0.0 0.0 0.1  0.0 0.0                 _________________________________  EKG  Sinus rhythm at 63 bpm ____________________________________________    ____________________________________________   INITIAL IMPRESSION / ASSESSMENT AND PLAN / ED COURSE  As part of my medical decision making, I reviewed the following data within the electronic MEDICAL RECORD NUMBER      No acute findings on physical exam, EKG, or labs.  Patient was concern for sleep apnea and a consult to sleep lab will be generated.        ____________________________________________   FINAL CLINICAL IMPRESSION  Well exam  ED Discharge Orders          Ordered    POCT Urinalysis Dipstick (CPT 81002)        Pending             Note:  This document was prepared using Dragon voice recognition software and may include unintentional dictation errors.     [1]  Social History Tobacco Use   Smoking status: Never   Smokeless tobacco: Never  Substance Use Topics   Alcohol use: Yes   Drug use: No   "

## 2024-02-21 NOTE — Progress Notes (Signed)
 Here for annual CO[facility] FF physical and voices no complaints.
